# Patient Record
Sex: Male | Born: 1960 | Race: White | Hispanic: No | Marital: Married | Smoking: Former smoker
Health system: Southern US, Community
[De-identification: ages and names within clinical notes are randomized; demographics above are authoritative.]

## PROBLEM LIST (undated history)

## (undated) DIAGNOSIS — D696 Thrombocytopenia, unspecified: Secondary | ICD-10-CM

## (undated) DIAGNOSIS — Z86010 Personal history of colonic polyps: Secondary | ICD-10-CM

## (undated) DIAGNOSIS — E7800 Pure hypercholesterolemia, unspecified: Secondary | ICD-10-CM

## (undated) DIAGNOSIS — M792 Neuralgia and neuritis, unspecified: Secondary | ICD-10-CM

## (undated) DIAGNOSIS — N529 Male erectile dysfunction, unspecified: Secondary | ICD-10-CM

## (undated) DIAGNOSIS — E559 Vitamin D deficiency, unspecified: Secondary | ICD-10-CM

## (undated) DIAGNOSIS — E669 Obesity, unspecified: Secondary | ICD-10-CM

## (undated) DIAGNOSIS — J301 Allergic rhinitis due to pollen: Secondary | ICD-10-CM

## (undated) DIAGNOSIS — C61 Malignant neoplasm of prostate: Secondary | ICD-10-CM

## (undated) DIAGNOSIS — F1910 Other psychoactive substance abuse, uncomplicated: Secondary | ICD-10-CM

## (undated) DIAGNOSIS — K635 Polyp of colon: Secondary | ICD-10-CM

## (undated) HISTORY — DX: Pure hypercholesterolemia, unspecified: E78.00

## (undated) HISTORY — PX: COLONOSCOPY: SHX174

## (undated) HISTORY — DX: Other psychoactive substance abuse, uncomplicated: F19.10

## (undated) HISTORY — DX: Personal history of colonic polyps: Z86.010

## (undated) HISTORY — DX: Polyp of colon: K63.5

## (undated) HISTORY — DX: Obesity, unspecified: E66.9

## (undated) HISTORY — DX: Male erectile dysfunction, unspecified: N52.9

## (undated) HISTORY — DX: Allergic rhinitis due to pollen: J30.1

## (undated) HISTORY — PX: MOUTH SURGERY: SHX715

## (undated) HISTORY — DX: Neuralgia and neuritis, unspecified: M79.2

## (undated) HISTORY — DX: Malignant neoplasm of prostate: C61

## (undated) HISTORY — DX: Thrombocytopenia, unspecified: D69.6

## (undated) HISTORY — DX: Vitamin D deficiency, unspecified: E55.9

---

## 1979-08-10 DIAGNOSIS — F1910 Other psychoactive substance abuse, uncomplicated: Secondary | ICD-10-CM

## 1979-08-10 HISTORY — DX: Other psychoactive substance abuse, uncomplicated: F19.10

## 2006-12-09 HISTORY — PX: KNEE ARTHROSCOPY: SHX127

## 2007-04-02 ENCOUNTER — Ambulatory Visit (HOSPITAL_COMMUNITY): Admission: RE | Admit: 2007-04-02 | Discharge: 2007-04-02 | Payer: Self-pay | Admitting: Family Medicine

## 2009-12-09 DIAGNOSIS — C61 Malignant neoplasm of prostate: Secondary | ICD-10-CM

## 2009-12-09 HISTORY — DX: Malignant neoplasm of prostate: C61

## 2010-05-15 ENCOUNTER — Encounter (INDEPENDENT_AMBULATORY_CARE_PROVIDER_SITE_OTHER): Payer: Self-pay | Admitting: *Deleted

## 2010-05-25 ENCOUNTER — Encounter (INDEPENDENT_AMBULATORY_CARE_PROVIDER_SITE_OTHER): Payer: Self-pay | Admitting: *Deleted

## 2010-05-29 ENCOUNTER — Ambulatory Visit: Payer: Self-pay | Admitting: Internal Medicine

## 2010-06-08 ENCOUNTER — Ambulatory Visit: Payer: Self-pay | Admitting: Internal Medicine

## 2010-06-08 DIAGNOSIS — Z860101 Personal history of adenomatous and serrated colon polyps: Secondary | ICD-10-CM

## 2010-06-08 DIAGNOSIS — Z86010 Personal history of colonic polyps: Secondary | ICD-10-CM

## 2010-06-08 HISTORY — DX: Personal history of adenomatous and serrated colon polyps: Z86.0101

## 2010-06-08 HISTORY — DX: Personal history of colonic polyps: Z86.010

## 2010-06-08 HISTORY — PX: COLONOSCOPY W/ BIOPSIES: SHX1374

## 2010-06-14 ENCOUNTER — Encounter: Payer: Self-pay | Admitting: Internal Medicine

## 2010-08-10 ENCOUNTER — Ambulatory Visit (HOSPITAL_COMMUNITY): Admission: RE | Admit: 2010-08-10 | Discharge: 2010-08-10 | Payer: Self-pay | Admitting: Urology

## 2010-09-24 ENCOUNTER — Inpatient Hospital Stay (HOSPITAL_COMMUNITY): Admission: RE | Admit: 2010-09-24 | Discharge: 2010-09-25 | Payer: Self-pay | Admitting: Urology

## 2010-09-24 ENCOUNTER — Encounter (INDEPENDENT_AMBULATORY_CARE_PROVIDER_SITE_OTHER): Payer: Self-pay | Admitting: Urology

## 2010-09-24 HISTORY — PX: PROSTATECTOMY: SHX69

## 2011-01-08 NOTE — Letter (Signed)
Summary: [facility] Instructions  [facility]  [address] [place], [place] [address]   Phone: [phone]  Fax: [phone]       Omar [name]    08-15-1961    MRN: [id number]        Procedure Day Dorna Bloom:  Farrell Ours  06/08/10     Arrival Time:  8:00AM      Procedure Time:  9:00AM     Location of Procedure:                    Juliann Pares _  [facility] (4th Floor)                       PREPARATION FOR COLONOSCOPY WITH MOVIPREP   Starting 5 days prior to your procedure 06/03/10 do not eat nuts, seeds, popcorn, corn, beans, peas,  salads, or any raw vegetables.  Do not take any fiber supplements (e.g. Metamucil, Citrucel, and Benefiber).  THE DAY BEFORE YOUR PROCEDURE         DATE: 06/07/10  DAY: THURSDAY  1.  Drink clear liquids the entire day-NO SOLID FOOD  2.  Do not drink anything colored red or purple.  Avoid juices with pulp.  No orange juice.  3.  Drink at least 64 oz. (8 glasses) of fluid/clear liquids during the day to prevent dehydration and help the prep work efficiently.  CLEAR LIQUIDS INCLUDE: Water Jello Ice Popsicles Tea (sugar ok, no milk/cream) Powdered fruit flavored drinks Coffee (sugar ok, no milk/cream) Gatorade Juice: apple, white grape, white cranberry  Lemonade Clear bullion, consomm, broth Carbonated beverages (any kind) Strained chicken noodle soup Hard Candy                             4.  In the morning, mix first dose of MoviPrep solution:    Empty 1 Pouch A and 1 Pouch B into the disposable container    Add lukewarm drinking water to the top line of the container. Mix to dissolve    Refrigerate (mixed solution should be used within 24 hrs)  5.  Begin drinking the prep at 5:00 p.m. The MoviPrep container is divided by 4 marks.   Every 15 minutes drink the solution down to the next mark (approximately 8 oz) until the full liter is complete.   6.  Follow completed prep with 16 oz of clear liquid of your choice  (Nothing red or purple).  Continue to drink clear liquids until bedtime.  7.  Before going to bed, mix second dose of MoviPrep solution:    Empty 1 Pouch A and 1 Pouch B into the disposable container    Add lukewarm drinking water to the top line of the container. Mix to dissolve    Refrigerate  THE DAY OF YOUR PROCEDURE      DATE: 06/08/10  DAY: FRIDAY  Beginning at 4:00AM (5 hours before procedure):         1. Every 15 minutes, drink the solution down to the next mark (approx 8 oz) until the full liter is complete.  2. Follow completed prep with 16 oz. of clear liquid of your choice.    3. You may drink clear liquids until 7:00AM (2 HOURS BEFORE PROCEDURE).   MEDICATION INSTRUCTIONS  Unless otherwise instructed, you should take regular prescription medications with a small sip of water   as early as possible the morning  of your procedure.  Diabetic patients - see separate instructions.  Stop taking Plavix or Aggrenox on  _  _  (7 days before procedure).     Stop taking Coumadin on  _ _  (5 days before procedure).  Additional medication instructions: _         OTHER INSTRUCTIONS  You will need a responsible adult at least 51 years of age to accompany you and drive you home.   This person must remain in the waiting room during your procedure.  Wear loose fitting clothing that is easily removed.  Leave jewelry and other valuables at home.  However, you may wish to bring a book to read or  an iPod/MP3 player to listen to music as you wait for your procedure to start.  Remove all body piercing jewelry and leave at home.  Total time from sign-in until discharge is approximately 2-3 hours.  You should go home directly after your procedure and rest.  You can resume normal activities the  day after your procedure.  The day of your procedure you should not:   Drive   Make legal decisions   Operate machinery   Drink alcohol   Return to work  You will receive  specific instructions about eating, activities and medications before you leave.    The above instructions have been reviewed and explained to me by   _______________________    I fully understand and can verbalize these instructions _____________________________ Date _________

## 2011-01-08 NOTE — Procedures (Signed)
Summary: Colonoscopy  Patient: Omar [name] Note: All result statuses are Final unless otherwise noted.  Tests: (1) Colonoscopy (COL)   COL Colonoscopy           DONE     [facility]     [address]     [place], [place]  [address]           COLONOSCOPY PROCEDURE REPORT           PATIENT:  [name], [name]  MR#:  [id number]     BIRTHDATE:  Apr 24, 1961, 48 yrs. old  GENDER:  male     ENDOSCOPIST:  Iva Boop, MD, [facility]     REF. BY:  Charlesetta Shanks, M.D.     PROCEDURE DATE:  06/08/2010     PROCEDURE:  Colonoscopy with biopsy     ASA CLASS:  Class I     INDICATIONS:  Routine Risk Screening     MEDICATIONS:   Fentanyl 75 mcg IV, Versed 9 mg IV           DESCRIPTION OF PROCEDURE:   After the risks benefits and     alternatives of the procedure were thoroughly explained, informed     consent was obtained.  Digital rectal exam was performed and     revealed no abnormalities and normal prostate.   The LB [id number]     [id number] endoscope was introduced through the anus and advanced to     the cecum, which was identified by both the appendix and ileocecal     valve, without limitations.  The quality of the prep was     excellent, using MoviPrep.  The instrument was then slowly     withdrawn as the colon was fully examined.     Insertion: 2:01 minutes Withdrawal: 13:41 minutes     <<PROCEDUREIMAGES>>           FINDINGS:  Two polyps were found. They were diminutive. 1-2 mm     ascending colon polyp and a 2-3 mm sigmoid polyp. The polyps were     removed using cold biopsy forceps.  This was otherwise a normal     examination of the colon.   Retroflexed views in the rectum     revealed internal hemorrhoids.    The scope was then withdrawn     from the patient and the procedure completed.           COMPLICATIONS:  None     ENDOSCOPIC IMPRESSION:     1) Two diminutive polyps removed     2) Internal hemorrhoids     3) Otherwise normal examination, excellent prep        REPEAT EXAM:  In for Colonoscopy, pending biopsy results.           Iva Boop, MD, [facility]           CC:  Charlesetta Shanks, MD and The Patient           n.     eSIGNED:   Iva Boop at 06/08/2010 09:43 AM           Rosezena Sensor, [id number]  Note: An exclamation mark (!) indicates a result that was not dispersed into the flowsheet. Document Creation Date: 06/08/2010 9:43 AM _______________________________________________________________________  (1) Order result status: Final Collection or observation date-time: 06/08/2010 09:27 Requested date-time:  Receipt date-time:  Reported date-time:  Referring Physician:   Ordering Physician: Stan Head [phone]) Specimen Source:  Source: EndoProS  Filler Order Number: [id number] Lab site:   Appended Document: Colonoscopy     Procedures Next Due Date:    Colonoscopy: 06/2015

## 2011-01-08 NOTE — Letter (Signed)
Summary: Patient Notice- Polyp Results  [facility]  [address] [place], [place] [address]   Phone: [phone]  Fax: [phone]        June 14, 2010 MRN: [id number]    Omar [name] [address] [place], [place]  [address]    Dear [name],  One of the polyps removed from your colon was adenomatous. This means that it was pre-cancerous or that  it had the potential to change into cancer over time. The other polyp turned out to be normal colon tissue that looked like a polyp.  I recommend that you have a repeat colonoscopy in 5 years to determine if you have developed any new polyps over time. If you develop any new rectal bleeding, abdominal pain or significant bowel habit changes, please contact us before then.  Please call us if you are having persistent problems or have questions about your condition that have not been fully answered at this time.  Sincerely,  Iva Boop MD, [facility]  This letter has been electronically signed by your physician.  Appended Document: Patient Notice- Polyp Results letter mailed

## 2011-01-08 NOTE — Miscellaneous (Signed)
Summary: DIR COL SCR-AGE...EM  Clinical Lists Changes  Medications: Added new medication of MOVIPREP 100 GM SOLR (PEG-KCL-NACL-NASULF-NA ASC-C) as directed - Signed Rx of MOVIPREP 100 GM SOLR (PEG-KCL-NACL-NASULF-NA ASC-C) as directed;  #1 x 0;  Signed;  Entered by: Darcey Nora RN, CGRN;  Authorized by: Iva Boop MD, FACG;  Method used: Electronically to [facility] [address] [id number]*, [address], [place], [place], [place]  [address], Ph: [phone], Fax: [phone]    Prescriptions: MOVIPREP 100 GM SOLR (PEG-KCL-NACL-NASULF-NA ASC-C) as directed  #1 x 0   Entered by:   Darcey Nora RN, CGRN   Authorized by:   Iva Boop MD, [facility]   Signed by:   Darcey Nora RN, CGRN on 05/29/2010   Method used:   Electronically to        [facility] [id number]* (retail)       [address]       [place], [place]  [address]       Ph: [phone]       Fax: [phone]   RxID:   [id number]

## 2011-01-08 NOTE — Letter (Signed)
Summary: Previsit letter  [facility]  [address] [place], [place] [address]   Phone: [phone]  Fax: [phone]       05/15/2010 MRN: [id number]  Omar [name] [address] [place], [place]  [address]  Dear Mr. Nale,  Welcome to the Gastroenterology Division at [facility].    You are scheduled to see a nurse for your pre-procedure visit on 05-29-10 at 10:30am on the 3rd floor at [facility], [address]  We ask that you try to arrive at our office 15 minutes prior to your appointment time to allow for check-in.  Your nurse visit will consist of discussing your medical and surgical history, your immediate family medical history, and your medications.    Please bring a complete list of all your medications or, if you prefer, bring the medication bottles and we will list them.  We will need to be aware of both prescribed and over the counter drugs.  We will need to know exact dosage information as well.  If you are on blood thinners (Coumadin, Plavix, Aggrenox, Ticlid, etc.) please call our office today/prior to your appointment, as we need to consult with your physician about holding your medication.   Please be prepared to read and sign documents such as consent forms, a financial agreement, and acknowledgement forms.  If necessary, and with your consent, a friend or relative is welcome to sit-in on the nurse visit with you.  Please bring your insurance card so that we may make a copy of it.  If your insurance requires a referral to see a specialist, please bring your referral form from your primary care physician.  No co-pay is required for this nurse visit.     If you cannot keep your appointment, please call [phone] to cancel or reschedule prior to your appointment date.  This allows [place] the opportunity to schedule an appointment for another patient in need of care.    Thank you for choosing [facility] for your  medical needs.  We appreciate the opportunity to care for you.  Please visit [place] at our website  to learn more about our practice.                     Sincerely.                                                                                                                   The Gastroenterology Division

## 2011-02-20 LAB — TYPE AND SCREEN
ABO/RH(D): B POS
Antibody Screen: NEGATIVE

## 2011-02-20 LAB — HEMOGLOBIN AND HEMATOCRIT, BLOOD
HCT: 39.9 % (ref 39.0–52.0)
HCT: 40.3 % (ref 39.0–52.0)
Hemoglobin: 13.9 g/dL (ref 13.0–17.0)
Hemoglobin: 14.2 g/dL (ref 13.0–17.0)

## 2011-02-20 LAB — ABO/RH: ABO/RH(D): B POS

## 2011-02-21 LAB — CBC
HCT: 42.4 % (ref 39.0–52.0)
Hemoglobin: 15 g/dL (ref 13.0–17.0)
MCH: 32.3 pg (ref 26.0–34.0)
MCHC: 35.5 g/dL (ref 30.0–36.0)
MCV: 91.1 fL (ref 78.0–100.0)
Platelets: 134 10*3/uL — ABNORMAL LOW (ref 150–400)
RBC: 4.65 MIL/uL (ref 4.22–5.81)
RDW: 13.1 % (ref 11.5–15.5)
WBC: 5.7 10*3/uL (ref 4.0–10.5)

## 2011-02-21 LAB — BASIC METABOLIC PANEL
BUN: 12 mg/dL (ref 6–23)
CO2: 27 mEq/L (ref 19–32)
Calcium: 9.1 mg/dL (ref 8.4–10.5)
Chloride: 106 mEq/L (ref 96–112)
Creatinine, Ser: 0.9 mg/dL (ref 0.4–1.5)
GFR calc Af Amer: >60 mL/min (ref >60)
GFR calc non Af Amer: >60 mL/min (ref >60)
Glucose, Bld: 96 mg/dL (ref 70–99)
Potassium: 4.1 mEq/L (ref 3.5–5.1)
Sodium: 139 mEq/L (ref 135–145)

## 2011-02-21 LAB — SURGICAL PCR SCREEN
MRSA, PCR: NEGATIVE
Staphylococcus aureus: NEGATIVE

## 2013-01-01 ENCOUNTER — Encounter: Payer: Self-pay | Admitting: Internal Medicine

## 2013-01-07 ENCOUNTER — Encounter: Payer: Self-pay | Admitting: Internal Medicine

## 2013-01-20 ENCOUNTER — Encounter: Payer: Self-pay | Admitting: *Deleted

## 2013-01-26 ENCOUNTER — Ambulatory Visit: Payer: Self-pay | Admitting: Internal Medicine

## 2013-02-11 ENCOUNTER — Telehealth: Payer: Self-pay | Admitting: *Deleted

## 2013-02-11 NOTE — Telephone Encounter (Signed)
Tried to reach patient about weather information.  Home phone is disconnected and at work number could not find him.

## 2013-02-12 ENCOUNTER — Ambulatory Visit: Payer: Self-pay | Admitting: Internal Medicine

## 2013-02-26 ENCOUNTER — Encounter: Payer: Self-pay | Admitting: Internal Medicine

## 2013-03-23 ENCOUNTER — Ambulatory Visit: Payer: Self-pay | Admitting: Internal Medicine

## 2013-04-15 ENCOUNTER — Ambulatory Visit: Payer: Self-pay | Admitting: Internal Medicine

## 2015-05-26 ENCOUNTER — Encounter: Payer: Self-pay | Admitting: Internal Medicine

## 2015-05-29 ENCOUNTER — Ambulatory Visit (AMBULATORY_SURGERY_CENTER): Payer: Self-pay | Admitting: *Deleted

## 2015-05-29 VITALS — Ht 72.0 in | Wt 221.0 lb

## 2015-05-29 DIAGNOSIS — Z86010 Personal history of colonic polyps: Secondary | ICD-10-CM

## 2015-05-29 NOTE — Progress Notes (Signed)
No post op anesthesia complications Not on diet drugs Not on home 02 No egg or soy allergies emmi video emailed to home email address

## 2015-06-19 ENCOUNTER — Encounter: Payer: Self-pay | Admitting: Internal Medicine

## 2015-06-30 ENCOUNTER — Ambulatory Visit (AMBULATORY_SURGERY_CENTER): Payer: BC Managed Care – PPO | Admitting: Internal Medicine

## 2015-06-30 ENCOUNTER — Encounter: Payer: Self-pay | Admitting: Internal Medicine

## 2015-06-30 VITALS — BP 113/72 | HR 57 | Temp 97.9°F | Resp 32 | Ht 72.0 in | Wt 221.0 lb

## 2015-06-30 DIAGNOSIS — D125 Benign neoplasm of sigmoid colon: Secondary | ICD-10-CM

## 2015-06-30 DIAGNOSIS — Z86010 Personal history of colonic polyps: Secondary | ICD-10-CM

## 2015-06-30 DIAGNOSIS — D123 Benign neoplasm of transverse colon: Secondary | ICD-10-CM | POA: Diagnosis not present

## 2015-06-30 DIAGNOSIS — K635 Polyp of colon: Secondary | ICD-10-CM

## 2015-06-30 DIAGNOSIS — D124 Benign neoplasm of descending colon: Secondary | ICD-10-CM

## 2015-06-30 MED ORDER — SODIUM CHLORIDE 0.9 % IV SOLN: 500.0000 mL | INTRAVENOUS | Status: DC

## 2015-06-30 NOTE — Progress Notes (Signed)
Called to room to assist during endoscopic procedure.  Patient ID and intended procedure confirmed with present staff. Received instructions for my participation in the procedure from the performing physician.  

## 2015-06-30 NOTE — Op Note (Signed)
[facility]  [address] [place], [address]   COLONOSCOPY PROCEDURE REPORT  PATIENT: [name], Omar  MR#: [id number] BIRTHDATE: October 20, 1961 , 79  yrs. old GENDER: male ENDOSCOPIST: Gatha Mayer, MD, [facility] PROCEDURE DATE:  06/30/2015 PROCEDURE:   Colonoscopy, surveillance , Colonoscopy with biopsy, and Colonoscopy with snare polypectomy First Screening Colonoscopy - Avg.  risk and is 50 yrs.  old or older - No.  Prior Negative Screening - Now for repeat screening. N/A  History of Adenoma - Now for follow-up colonoscopy & has been > or = to 3 yrs.  Yes hx of adenoma.  Has been 3 or more years since last colonoscopy.  Polyps removed today? Yes ASA CLASS:   Class II INDICATIONS:Surveillance due to prior colonic neoplasia and PH Colon Adenoma. MEDICATIONS: Propofol 330 mg IV and Monitored anesthesia care  DESCRIPTION OF PROCEDURE:   After the risks benefits and alternatives of the procedure were thoroughly explained, informed consent was obtained.  The digital rectal exam revealed no rectal mass and revealed a surgically absent prostate.   The LB [id number]  endoscope was introduced through the anus and advanced to the cecum, which was identified by both the appendix and ileocecal valve. No adverse events experienced.   The quality of the prep was excellent.  (MiraLax was used)  The instrument was then slowly withdrawn as the colon was fully examined. Estimated blood loss is zero unless otherwise noted in this procedure report.      COLON FINDINGS: Four sessile polyps ranging from 2 to 39mm in size were found in the transverse colon, descending colon, and sigmoid colon.  Polypectomies were performed with cold forceps and with a cold snare.  The resection was complete, the polyp tissue was completely retrieved and sent to histology.   The examination was otherwise normal.  Retroflexed views revealed no abnormalities. The time to cecum = 2.9 Withdrawal  time = 10.1   The scope was withdrawn and the procedure completed. COMPLICATIONS: There were no immediate complications.  ENDOSCOPIC IMPRESSION: 1.   Four sessile polyps ranging from 2 to 51mm in size were found in the transverse colon, descending colon, and sigmoid colon; polypectomies were performed with cold forceps and with a cold snare 2.   The examination was otherwise normal - hx adenoma 2011 + FHx CRCA elderly mother  RECOMMENDATIONS: Timing of repeat colonoscopy will be determined by pathology findings.  eSigned:  Gatha Mayer, MD, [facility] 06/30/2015 3:57 PM   cc: The Patient and Velna Hatchet, MD

## 2015-06-30 NOTE — Patient Instructions (Addendum)
I found and removed 4 tiny polyps. I will let you know pathology results and when to have another routine colonoscopy by mail.  I appreciate the opportunity to care for you. Gatha Mayer, MD, FACG  YOU HAD AN ENDOSCOPIC PROCEDURE TODAY AT [facility]:   Refer to the procedure report that was given to you for any specific questions about what was found during the examination.  If the procedure report does not answer your questions, please call your gastroenterologist to clarify.  If you requested that your care partner not be given the details of your procedure findings, then the procedure report has been included in a sealed envelope for you to review at your convenience later.  YOU SHOULD EXPECT: Some feelings of bloating in the abdomen. Passage of more gas than usual.  Walking can help get rid of the air that was put into your GI tract during the procedure and reduce the bloating. If you had a lower endoscopy (such as a colonoscopy or flexible sigmoidoscopy) you may notice spotting of blood in your stool or on the toilet paper. If you underwent a bowel prep for your procedure, you may not have a normal bowel movement for a few days.  Please Note:  You might notice some irritation and congestion in your nose or some drainage.  This is from the oxygen used during your procedure.  There is no need for concern and it should clear up in a day or so.  SYMPTOMS TO REPORT IMMEDIATELY:   Following lower endoscopy (colonoscopy or flexible sigmoidoscopy):  Excessive amounts of blood in the stool  Significant tenderness or worsening of abdominal pains  Swelling of the abdomen that is new, acute  Fever of 100F or higher  For urgent or emergent issues, a gastroenterologist can be reached at any hour by calling [phone].   DIET: Your first meal following the procedure should be a small meal and then it is ok to progress to your normal diet. Heavy or fried foods are  harder to digest and may make you feel nauseous or bloated.  Likewise, meals heavy in dairy and vegetables can increase bloating.  Drink plenty of fluids but you should avoid alcoholic beverages for 24 hours.  ACTIVITY:  You should plan to take it easy for the rest of today and you should NOT DRIVE or use heavy machinery until tomorrow (because of the sedation medicines used during the test).    FOLLOW UP: Our staff will call the number listed on your records the next business day following your procedure to check on you and address any questions or concerns that you may have regarding the information given to you following your procedure. If we do not reach you, we will leave a message.  However, if you are feeling well and you are not experiencing any problems, there is no need to return our call.  We will assume that you have returned to your regular daily activities without incident.  If any biopsies were taken you will be contacted by phone or by letter within the next 1-3 weeks.  Please call us at [phone] if you have not heard about the biopsies in 3 weeks.    SIGNATURES/CONFIDENTIALITY: You and/or your care partner have signed paperwork which will be entered into your electronic medical record.  These signatures attest to the fact that that the information above on your After Visit Summary has been reviewed and is understood.  Full responsibility  of the confidentiality of this discharge information lies with you and/or your care-partner.  Polyp information given.

## 2015-06-30 NOTE — Progress Notes (Signed)
To recovery, report to Scott, RN, VSS 

## 2015-07-03 ENCOUNTER — Telehealth: Payer: Self-pay | Admitting: *Deleted

## 2015-07-03 NOTE — Telephone Encounter (Signed)
  Follow up Call-  Call back number 06/30/2015  Post procedure Call Back phone  # [phone]  Permission to leave phone message Yes     Patient questions:  Do you have a fever, pain , or abdominal swelling? No. Pain Score  0 *  Have you tolerated food without any problems? Yes.    Have you been able to return to your normal activities? Yes.    Do you have any questions about your discharge instructions: Diet   No. Medications  No. Follow up visit  No.  Do you have questions or concerns about your Care? No.  Actions: * If pain score is 4 or above: No action needed, pain <4.

## 2015-07-04 ENCOUNTER — Encounter: Payer: Self-pay | Admitting: Internal Medicine

## 2015-07-04 DIAGNOSIS — Z86010 Personal history of colonic polyps: Secondary | ICD-10-CM

## 2015-07-04 NOTE — Progress Notes (Signed)
Quick Note:  3 diminutive adenomas - repeat colonoscopy 3 years 2019 ______

## 2015-11-08 ENCOUNTER — Encounter: Payer: Self-pay | Admitting: Internal Medicine

## 2016-09-04 ENCOUNTER — Other Ambulatory Visit: Payer: Self-pay | Admitting: Internal Medicine

## 2016-09-04 DIAGNOSIS — R740 Nonspecific elevation of levels of transaminase and lactic acid dehydrogenase [LDH]: Principal | ICD-10-CM

## 2016-09-04 DIAGNOSIS — R7401 Elevation of levels of liver transaminase levels: Secondary | ICD-10-CM

## 2016-09-12 ENCOUNTER — Ambulatory Visit
Admission: RE | Admit: 2016-09-12 | Discharge: 2016-09-12 | Disposition: A | Discharge status: Home / Self Care | Payer: BC Managed Care – PPO | Source: Ambulatory Visit | Attending: Internal Medicine | Admitting: Internal Medicine

## 2016-09-12 DIAGNOSIS — R740 Nonspecific elevation of levels of transaminase and lactic acid dehydrogenase [LDH]: Principal | ICD-10-CM

## 2016-09-12 DIAGNOSIS — R7401 Elevation of levels of liver transaminase levels: Secondary | ICD-10-CM

## 2016-10-01 ENCOUNTER — Institutional Professional Consult (permissible substitution): Payer: BC Managed Care – PPO | Admitting: Neurology

## 2018-10-05 ENCOUNTER — Encounter: Payer: Self-pay | Admitting: Internal Medicine

## 2018-10-21 ENCOUNTER — Encounter: Payer: Self-pay | Admitting: Internal Medicine

## 2018-11-04 ENCOUNTER — Ambulatory Visit (AMBULATORY_SURGERY_CENTER): Payer: Self-pay | Admitting: *Deleted

## 2018-11-04 VITALS — Ht 71.0 in | Wt 213.0 lb

## 2018-11-04 DIAGNOSIS — Z86010 Personal history of colonic polyps: Secondary | ICD-10-CM

## 2018-11-04 NOTE — Progress Notes (Signed)
Patient denies any allergies to eggs or soy. Patient denies any problems with anesthesia/sedation. Patient denies any oxygen use at home. Patient denies taking any diet/weight loss medications or blood thinners. EMMI education offered, pt declined.  

## 2018-11-11 ENCOUNTER — Encounter: Payer: Self-pay | Admitting: Internal Medicine

## 2018-11-25 ENCOUNTER — Encounter: Payer: Self-pay | Admitting: Internal Medicine

## 2018-11-25 ENCOUNTER — Ambulatory Visit (AMBULATORY_SURGERY_CENTER): Payer: BC Managed Care – PPO | Admitting: Internal Medicine

## 2018-11-25 VITALS — BP 110/69 | HR 52 | Temp 98.0°F | Resp 14 | Ht 71.0 in | Wt 213.0 lb

## 2018-11-25 DIAGNOSIS — Z86010 Personal history of colonic polyps: Secondary | ICD-10-CM | POA: Diagnosis not present

## 2018-11-25 DIAGNOSIS — D125 Benign neoplasm of sigmoid colon: Secondary | ICD-10-CM

## 2018-11-25 DIAGNOSIS — D120 Benign neoplasm of cecum: Secondary | ICD-10-CM

## 2018-11-25 MED ORDER — SODIUM CHLORIDE 0.9 % IV SOLN: 500.0000 mL | Freq: Once | INTRAVENOUS | Status: DC

## 2018-11-25 NOTE — Progress Notes (Signed)
Called to room to assist during endoscopic procedure.  Patient ID and intended procedure confirmed with present staff. Received instructions for my participation in the procedure from the performing physician.  

## 2018-11-25 NOTE — Progress Notes (Signed)
PT taken to PACU. Monitors in place. VSS. Report given to RN. 

## 2018-11-25 NOTE — Patient Instructions (Addendum)
   I found and removed 3 tiny polyps. I will let you know pathology results and when to have another routine colonoscopy by mail and/or My Chart.  I appreciate the opportunity to care for you. Gatha Mayer, MD, [facility]   Handout given on polyps.   YOU HAD AN ENDOSCOPIC PROCEDURE TODAY AT [facility]:   Refer to the procedure report that was given to you for any specific questions about what was found during the examination.  If the procedure report does not answer your questions, please call your gastroenterologist to clarify.  If you requested that your care partner not be given the details of your procedure findings, then the procedure report has been included in a sealed envelope for you to review at your convenience later.  YOU SHOULD EXPECT: Some feelings of bloating in the abdomen. Passage of more gas than usual.  Walking can help get rid of the air that was put into your GI tract during the procedure and reduce the bloating. If you had a lower endoscopy (such as a colonoscopy or flexible sigmoidoscopy) you may notice spotting of blood in your stool or on the toilet paper. If you underwent a bowel prep for your procedure, you may not have a normal bowel movement for a few days.  Please Note:  You might notice some irritation and congestion in your nose or some drainage.  This is from the oxygen used during your procedure.  There is no need for concern and it should clear up in a day or so.  SYMPTOMS TO REPORT IMMEDIATELY:   Following lower endoscopy (colonoscopy or flexible sigmoidoscopy):  Excessive amounts of blood in the stool  Significant tenderness or worsening of abdominal pains  Swelling of the abdomen that is new, acute  Fever of 100F or higher   For urgent or emergent issues, a gastroenterologist can be reached at any hour by calling [phone].   DIET:  We do recommend a small meal at first, but then you may proceed to your regular diet.  Drink  plenty of fluids but you should avoid alcoholic beverages for 24 hours.  ACTIVITY:  You should plan to take it easy for the rest of today and you should NOT DRIVE or use heavy machinery until tomorrow (because of the sedation medicines used during the test).    FOLLOW UP: Our staff will call the number listed on your records the next business day following your procedure to check on you and address any questions or concerns that you may have regarding the information given to you following your procedure. If we do not reach you, we will leave a message.  However, if you are feeling well and you are not experiencing any problems, there is no need to return our call.  We will assume that you have returned to your regular daily activities without incident.  If any biopsies were taken you will be contacted by phone or by letter within the next 1-3 weeks.  Please call us at [phone] if you have not heard about the biopsies in 3 weeks.    SIGNATURES/CONFIDENTIALITY: You and/or your care partner have signed paperwork which will be entered into your electronic medical record.  These signatures attest to the fact that that the information above on your After Visit Summary has been reviewed and is understood.  Full responsibility of the confidentiality of this discharge information lies with you and/or your care-partner.

## 2018-11-25 NOTE — Op Note (Signed)
[place] [facility] Patient Name: Omar [name] Procedure Date: 11/25/2018 3:12 PM MRN: [id number] Endoscopist: Gatha Mayer , MD Age: 57 Referring MD:  Date of Birth: [birth year] Gender: Male Account #: [id number] Procedure:                Colonoscopy Indications:              Surveillance: Personal history of adenomatous                            polyps on last colonoscopy 3 years ago Medicines:                Propofol per Anesthesia, Monitored Anesthesia Care Procedure:                Pre-Anesthesia Assessment:                           - Prior to the procedure, a History and Physical                            was performed, and patient medications and                            allergies were reviewed. The patient's tolerance of                            previous anesthesia was also reviewed. The risks                            and benefits of the procedure and the sedation                            options and risks were discussed with the patient.                            All questions were answered, and informed consent                            was obtained. Prior Anticoagulants: The patient has                            taken no previous anticoagulant or antiplatelet                            agents. ASA Grade Assessment: II - A patient with                            mild systemic disease. After reviewing the risks                            and benefits, the patient was deemed in                            satisfactory condition to undergo the procedure.  After obtaining informed consent, the colonoscope                            was passed under direct vision. Throughout the                            procedure, the patient's blood pressure, pulse, and                            oxygen saturations were monitored continuously. The                            Colonoscope was introduced through the anus and   advanced to the the cecum, identified by                            appendiceal orifice and ileocecal valve. The                            colonoscopy was performed without difficulty. The                            patient tolerated the procedure well. The quality                            of the bowel preparation was good. The bowel                            preparation used was Miralax. The ileocecal valve,                            appendiceal orifice, and rectum were photographed. Scope In: 3:24:30 PM Scope Out: 3:43:46 PM Scope Withdrawal Time: 0 hours 16 minutes 59 seconds  Total Procedure Duration: 0 hours 19 minutes 16 seconds  Findings:                 The perianal examination was normal.                           The digital rectal exam findings include surgically                            absent prostate.                           Two sessile polyps were found in the sigmoid colon.                            The polyps were 3 to 4 mm in size. These polyps                            were removed with a cold snare. Resection and                            retrieval  were complete. Verification of patient                            identification for the specimen was done. Estimated                            blood loss was minimal. Estimated blood loss was                            minimal.                           A 1 mm polyp was found in the cecum. The polyp was                            sessile. The polyp was removed with a cold snare.                            Resection and retrieval were complete. Verification                            of patient identification for the specimen was                            done. Estimated blood loss was minimal.                           The exam was otherwise without abnormality on                            direct and retroflexion views. Complications:            No immediate complications. Estimated Blood Loss:     Estimated  blood loss was minimal. 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                           - Two 3 to 4 mm polyps in the sigmoid colon,                            removed with a cold snare. Resected and retrieved.                           - One 1 mm polyp in the cecum, removed with a cold                            snare. Resected and retrieved.                           - The examination was otherwise normal on direct  and retroflexion views.                           - Personal history of colonic polyps adenomas 2011                            and 2016. Recommendation:           - Patient has a contact number available for                            emergencies. The signs and symptoms of potential                            delayed complications were discussed with the                            patient. Return to normal activities tomorrow.                            Written discharge instructions were provided to the                            patient.                           - Resume previous diet.                           - Continue present medications.                           - Repeat colonoscopy is recommended for                            surveillance. The colonoscopy date will be                            determined after pathology results from today's                            exam become available for review. Gatha Mayer, MD 11/25/2018 3:49:52 PM This report has been signed electronically.

## 2018-11-26 ENCOUNTER — Telehealth: Payer: Self-pay

## 2018-11-26 NOTE — Telephone Encounter (Signed)
  Follow up Call-  Call back number 11/25/2018  Post procedure Call Back phone  # [phone]  Permission to leave phone message Yes  Some recent data might be hidden     Patient questions:  Do you have a fever, pain , or abdominal swelling? No. Pain Score  0 *  Have you tolerated food without any problems? Yes.    Have you been able to return to your normal activities? Yes.    Do you have any questions about your discharge instructions: Diet   No. Medications  No. Follow up visit  No.  Do you have questions or concerns about your Care? No.  Actions: * If pain score is 4 or above: No action needed, pain <4.

## 2018-12-07 ENCOUNTER — Encounter: Payer: Self-pay | Admitting: Internal Medicine

## 2018-12-07 DIAGNOSIS — Z86010 Personal history of colonic polyps: Secondary | ICD-10-CM

## 2018-12-07 NOTE — Progress Notes (Signed)
2 diminutive adenomas Recall 2024-5 (5 yrs)

## 2020-02-05 ENCOUNTER — Ambulatory Visit: Payer: BC Managed Care – PPO | Attending: Internal Medicine

## 2020-02-05 DIAGNOSIS — Z23 Encounter for immunization: Secondary | ICD-10-CM | POA: Insufficient documentation

## 2020-02-05 NOTE — Progress Notes (Signed)
   Covid-19 Vaccination Clinic  Name:  Omar [name]    MRN: [id number] DOB: [birth year]  02/05/2020  Mr. [name] was observed post Covid-19 immunization for 15 minutes without incidence. He was provided with Vaccine Information Sheet and instruction to access the V-Safe system.   Mr. [name] was instructed to call 911 with any severe reactions post vaccine: Marland Kitchen Difficulty breathing  . Swelling of your face and throat  . A fast heartbeat  . A bad rash all over your body  . Dizziness and weakness    Immunizations Administered    Name Date Dose VIS Date Route   Pfizer COVID-19 Vaccine 02/05/2020  6:57 PM 0.3 mL 11/19/2019 Intramuscular   Manufacturer: [place]   Lot: [id number]   [place]: [id number]

## 2020-02-26 ENCOUNTER — Ambulatory Visit: Payer: BC Managed Care – PPO | Attending: Internal Medicine

## 2020-02-26 DIAGNOSIS — Z23 Encounter for immunization: Secondary | ICD-10-CM

## 2020-02-26 NOTE — Progress Notes (Signed)
   Covid-19 Vaccination Clinic  Name:  Omar [name]    MRN: [id number] DOB: 1961-09-01  02/26/2020  Mr. Omar [name] was observed post Covid-19 immunization for 15 minutes without incident. He was provided with Vaccine Information Sheet and instruction to access the V-Safe system.   Mr. Omar [name] was instructed to call 911 with any severe reactions post vaccine: Marland Kitchen Difficulty breathing  . Swelling of face and throat  . A fast heartbeat  . A bad rash all over body  . Dizziness and weakness   Immunizations Administered    Name Date Dose VIS Date Route   Pfizer COVID-19 Vaccine 02/26/2020 10:09 AM 0.3 mL 11/19/2019 Intramuscular   Manufacturer: [place]   Lot: [id number]   [place]: [id number]

## 2021-10-03 ENCOUNTER — Ambulatory Visit
Admission: RE | Admit: 2021-10-03 | Discharge: 2021-10-03 | Disposition: A | Discharge status: Home / Self Care | Payer: Self-pay | Source: Ambulatory Visit | Attending: Physical Medicine and Rehabilitation | Admitting: Physical Medicine and Rehabilitation

## 2021-10-03 ENCOUNTER — Other Ambulatory Visit: Payer: Self-pay

## 2021-10-03 ENCOUNTER — Ambulatory Visit: Payer: Self-pay

## 2021-10-03 ENCOUNTER — Other Ambulatory Visit: Payer: Self-pay | Admitting: Physical Medicine and Rehabilitation

## 2021-10-03 DIAGNOSIS — R52 Pain, unspecified: Secondary | ICD-10-CM

## 2023-07-04 IMAGING — CR DG KNEE AP/LAT W/ SUNRISE*R*
3 series · 3 of 3 positions shown · non-contrast
Comparison: None.

CLINICAL DATA: Chronic bilateral increasing knee pain, left greater
than right

EXAM:
LEFT KNEE 3 VIEWS; RIGHT KNEE 3 VIEWS

[w knee ap right]
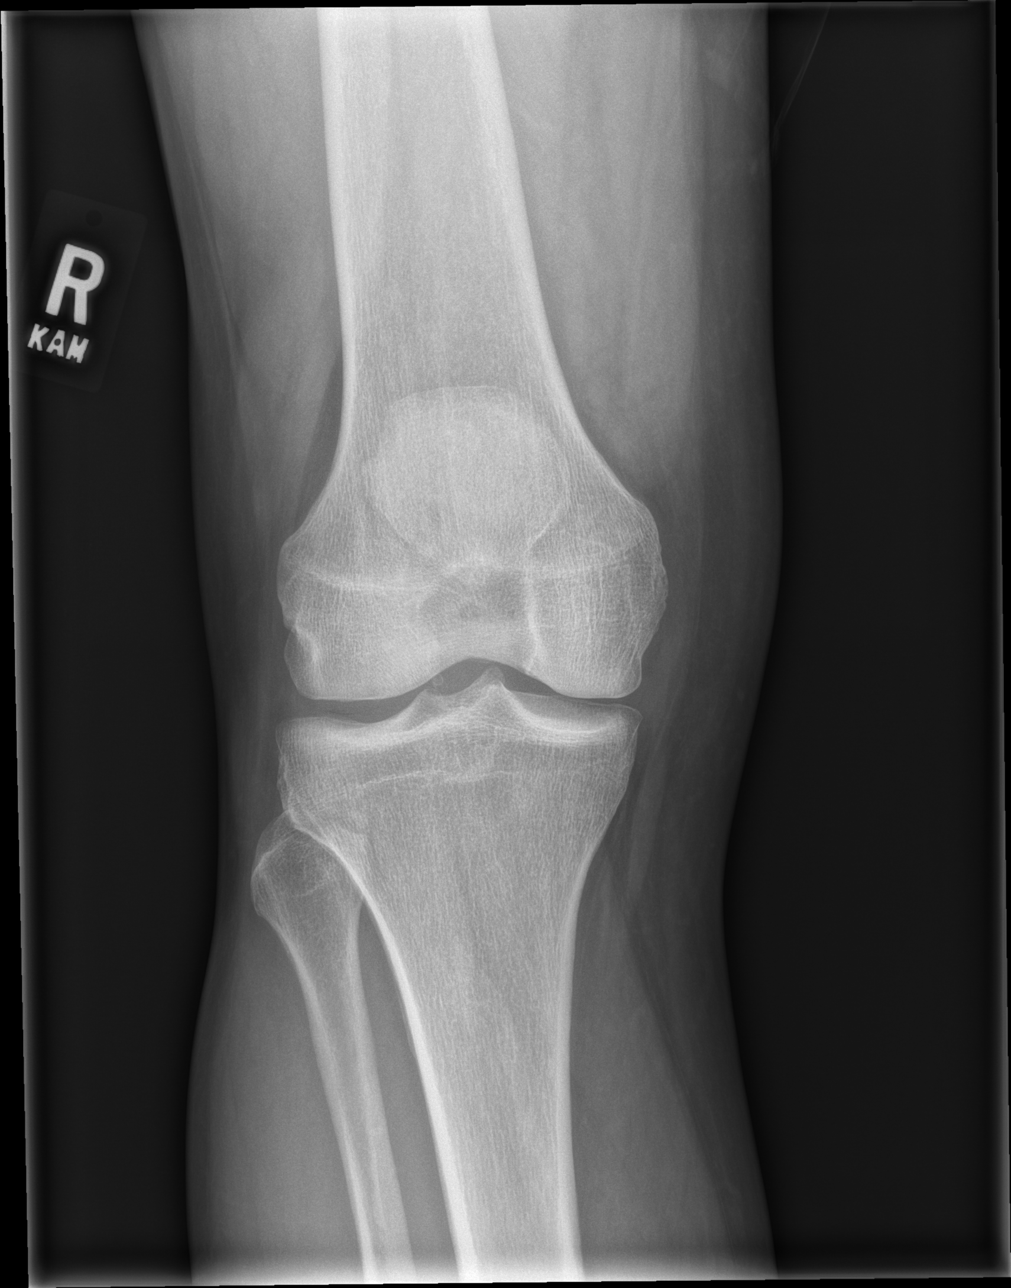

[w knee lat right]
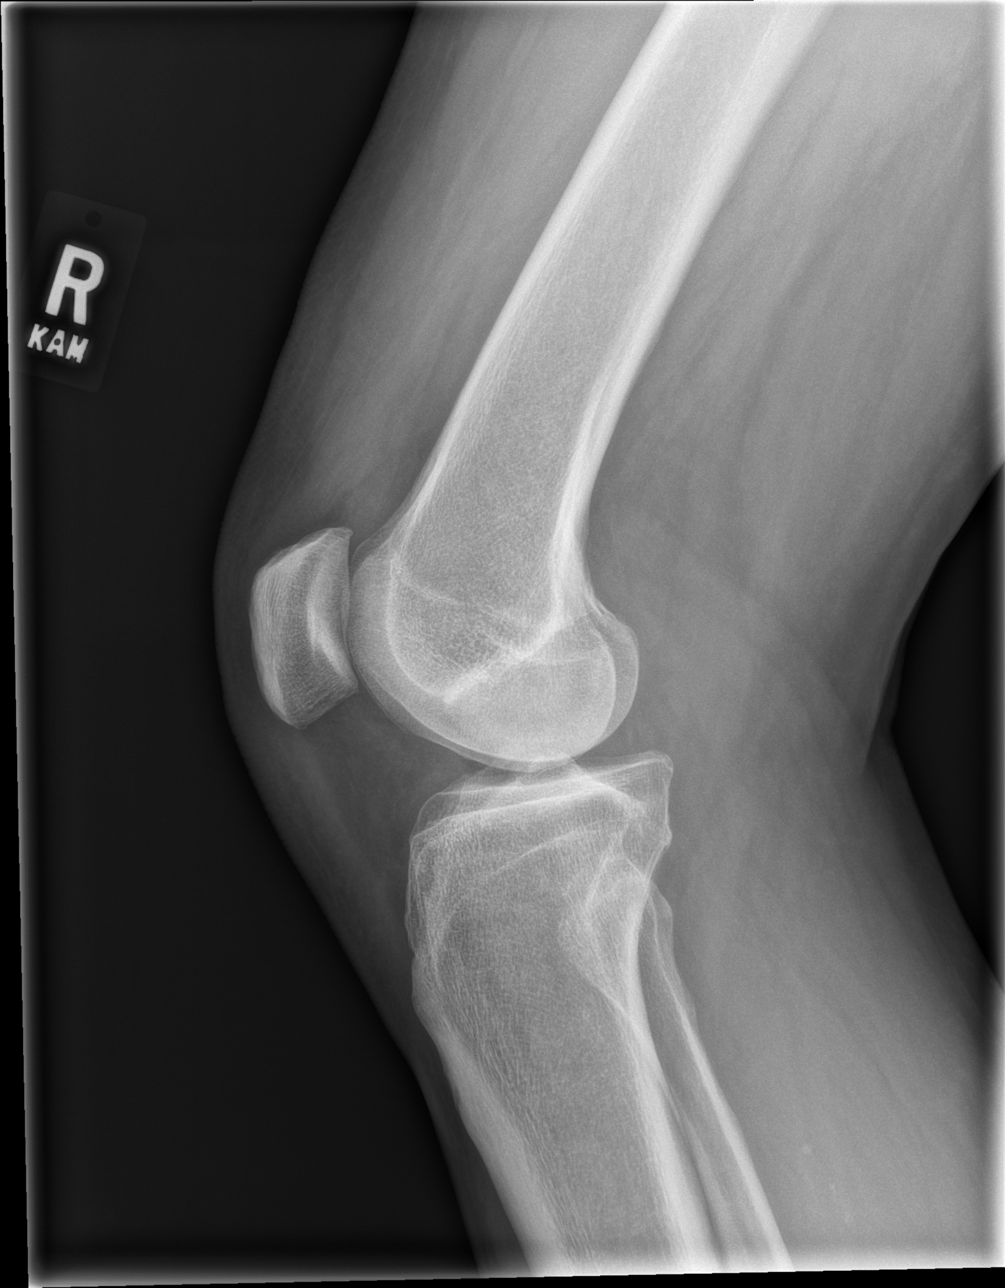

[x knee sunrise right]
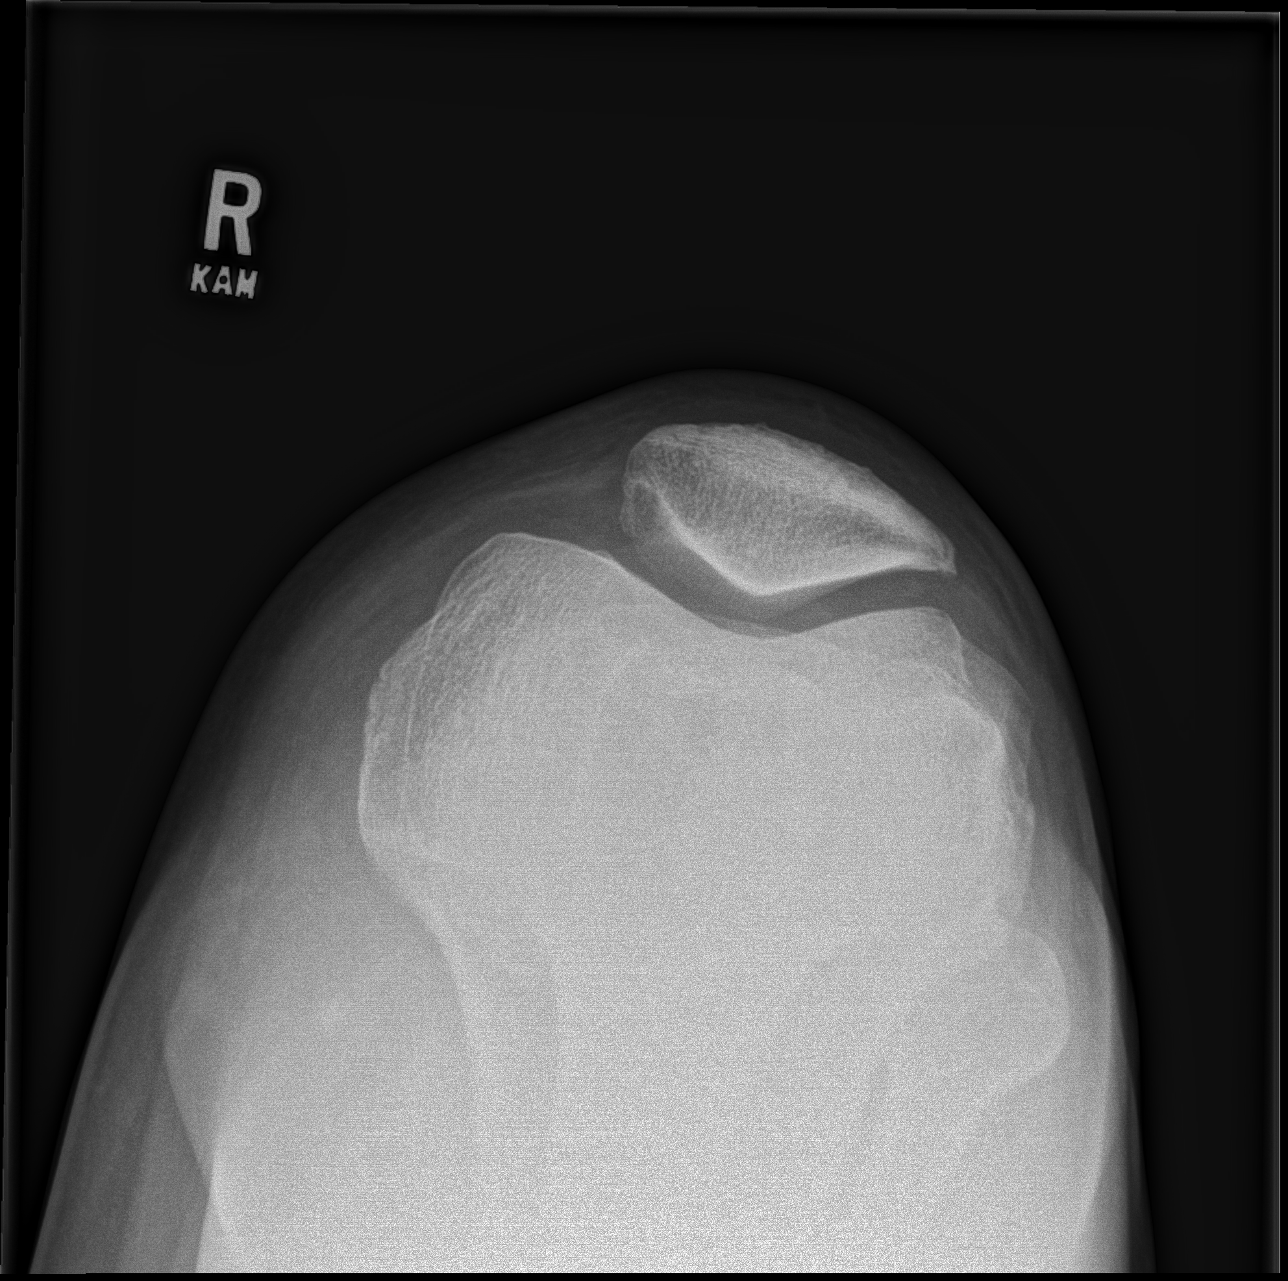

[3 of 3 positions shown; findings below may reference images not displayed]

FINDINGS: Right knee: Frontal, lateral, and sunrise views of the right knee
are obtained. No fracture, subluxation, or dislocation. Mild
patellofemoral compartmental osteoarthritis. No joint effusion. Soft
tissues are unremarkable.

Left knee: Frontal, lateral, and sunrise views of the left knee are
obtained. There is mild 3 compartmental osteoarthritis greatest in
the medial compartment. No fracture, subluxation, or dislocation. No
joint effusion. Soft tissues are unremarkable.
IMPRESSION: 1. Bilateral knee osteoarthritis, left greater than right.
2. No acute bony abnormality.

## 2023-10-29 ENCOUNTER — Encounter: Payer: Self-pay | Admitting: Internal Medicine

## 2023-11-04 ENCOUNTER — Encounter: Payer: Self-pay | Admitting: Internal Medicine

## 2023-11-17 ENCOUNTER — Telehealth: Payer: Self-pay | Admitting: *Deleted

## 2023-11-17 ENCOUNTER — Ambulatory Visit (AMBULATORY_SURGERY_CENTER): Payer: BC Managed Care – PPO | Admitting: *Deleted

## 2023-11-17 VITALS — Ht 72.0 in | Wt 208.0 lb

## 2023-11-17 DIAGNOSIS — Z860100 Personal history of colon polyps, unspecified: Secondary | ICD-10-CM

## 2023-11-17 NOTE — Telephone Encounter (Signed)
Unable to reach patient. Left VM requesting return call to reschedule PV to avoid cancellation of colonoscopy.

## 2023-11-17 NOTE — Telephone Encounter (Signed)
PT returned call 247pm please call [phone]

## 2023-11-17 NOTE — Progress Notes (Signed)
Previsit completed over telephone. Instructions mailed and sent through MyChart    No egg or soy allergy known to patient  No issues known to pt with past sedation with any surgeries or procedures Patient denies ever being told they had issues or difficulty with intubation  No FH of Malignant Hyperthermia Pt is not on diet pills Pt is not on  home 02  Pt is not on blood thinners  Pt denies issues with constipation  No A fib or A flutter Have any cardiac testing pending--NO Pt instructed to use [email or web address] or GoodRx for a price reduction on prep

## 2023-11-18 ENCOUNTER — Encounter: Payer: Self-pay | Admitting: Internal Medicine

## 2023-12-04 NOTE — Progress Notes (Signed)
[facility] History and Physical   Primary Care Physician:  Alysia Penna, MD   Reason for Procedure:   Hx colon polyps  Plan:    colonoscopy     HPI: Omar [name] is a 62 y.o. male w/ hx adenomatous colon polyps - last removed 2019. He is here for surveillance exam.   06/2010 - diminutive adenoma 06/30/2015 4 diminutive polyps removed - 3 adenomas 1 hyperplastic  11/25/2018 3 diminutive polyps removed - 2 adenomas 1 mucosal - recall 2024-5   Past Surgical History:  Procedure Laterality Date   COLONOSCOPY     COLONOSCOPY W/ BIOPSIES  06/08/2010   Colon polyps   KNEE ARTHROSCOPY Bilateral 2008   unsure of year   MOUTH SURGERY     jaw fracture   PROSTATECTOMY  09/24/2010   Dr. Heloise Purpura    Prior to Admission medications   Medication Sig Start Date End Date Taking? Authorizing Provider  acetaminophen (TYLENOL) 325 MG tablet 1 tablet as needed Orally every 6 hrs    [provider]  aspirin EC 81 MG tablet Take 81 mg by mouth daily.    [provider]  metFORMIN (GLUCOPHAGE) 500 MG tablet 1 tablet with a meal Orally twice a day for 90 days    [provider]  Multiple Vitamin (MULTIVITAMIN) tablet Take 1 tablet by mouth daily.    [provider]  Polyethylene Glycol 3350 (MIRALAX PO) Take 238 g by mouth once.    [provider]    Current Outpatient Medications  Medication Sig Dispense Refill   acetaminophen (TYLENOL) 325 MG tablet 1 tablet as needed Orally every 6 hrs     aspirin EC 81 MG tablet Take 81 mg by mouth daily.     metFORMIN (GLUCOPHAGE) 500 MG tablet 1 tablet with a meal Orally twice a day for 90 days     Multiple Vitamin (MULTIVITAMIN) tablet Take 1 tablet by mouth daily.     Polyethylene Glycol 3350 (MIRALAX PO) Take 238 g by mouth once.     Current Facility-Administered Medications  Medication Dose Route Frequency Provider Last Rate Last Admin   0.9 %  sodium chloride infusion  500 mL  Intravenous Once Iva Boop, MD        Allergies as of 12/05/2023   (No Known Allergies)    Family History  Problem Relation Age of Onset   Other Mother        Hereditary degenerative bone disease   Colon cancer Mother        unsure of origin (in her 52's)   Diabetes Father    Other Brother        colostomy for diarrhea   Rectal cancer Neg Hx    Colon polyps Neg Hx    Esophageal cancer Neg Hx    Stomach cancer Neg Hx    Colonic polyp Neg Hx     Social History   Socioeconomic History   Marital status: Married    Spouse name: Not on file   Number of children: 2   Years of education: Not on file   Highest education level: Not on file  Occupational History   Occupation: Runner, broadcasting/film/video  Tobacco Use   Smoking status: Former    Types: Cigarettes   Smokeless tobacco: Former    Types: Snuff  Vaping Use   Vaping status: Never Used  Substance and Sexual Activity   Alcohol use: Yes    Comment: 1-2 glass of wine  nightly   Drug use: No   Sexual activity: Not on file  Other Topics Concern   Not on file  Social History Narrative   Daily caffeine          Social Drivers of Health   Financial Resource Strain: Not on file  Food Insecurity: Not on file  Transportation Needs: Not on file  Physical Activity: Not on file  Stress: Not on file  Social Connections: Not on file  Intimate Partner Violence: Not on file    Review of Systems:  All other review of systems negative except as mentioned in the HPI.  Physical Exam: Vital signs BP (!) 139/91   Pulse 68   Temp 97.9 F (36.6 C) (Temporal)   Resp (!) 21   Ht 6' (1.829 m)   Wt 208 lb (94.3 kg)   SpO2 99%   BMI 28.21 kg/m   General:   Alert,  Well-developed, well-nourished, pleasant and cooperative in NAD Lungs:  Clear throughout to auscultation.   Heart:  Regular rate and rhythm; no murmurs, clicks, rubs,  or gallops. Abdomen:  Soft, nontender and nondistended. Normal bowel sounds.   Neuro/Psych:  Alert and  cooperative. Normal mood and affect. A and O x 3   @Tiana Sivertson  Sena Slate, MD, [facility] [phone] (pager) 12/05/2023 10:58 AM@

## 2023-12-05 ENCOUNTER — Encounter: Payer: Self-pay | Admitting: Internal Medicine

## 2023-12-05 ENCOUNTER — Ambulatory Visit: Payer: BC Managed Care – PPO | Admitting: Internal Medicine

## 2023-12-05 VITALS — BP 119/81 | HR 65 | Temp 97.9°F | Resp 16 | Ht 72.0 in | Wt 208.0 lb

## 2023-12-05 DIAGNOSIS — Z860102 Personal history of hyperplastic colon polyps: Secondary | ICD-10-CM | POA: Diagnosis not present

## 2023-12-05 DIAGNOSIS — Z1211 Encounter for screening for malignant neoplasm of colon: Secondary | ICD-10-CM

## 2023-12-05 DIAGNOSIS — Z9079 Acquired absence of other genital organ(s): Secondary | ICD-10-CM

## 2023-12-05 DIAGNOSIS — D125 Benign neoplasm of sigmoid colon: Secondary | ICD-10-CM

## 2023-12-05 DIAGNOSIS — K6289 Other specified diseases of anus and rectum: Secondary | ICD-10-CM

## 2023-12-05 DIAGNOSIS — D120 Benign neoplasm of cecum: Secondary | ICD-10-CM

## 2023-12-05 DIAGNOSIS — Z860101 Personal history of adenomatous and serrated colon polyps: Secondary | ICD-10-CM | POA: Diagnosis not present

## 2023-12-05 DIAGNOSIS — Z860100 Personal history of colon polyps, unspecified: Secondary | ICD-10-CM

## 2023-12-05 MED ORDER — SODIUM CHLORIDE 0.9 % IV SOLN: 500.0000 mL | Freq: Once | INTRAVENOUS | Status: DC

## 2023-12-05 NOTE — Progress Notes (Signed)
Pt's states no medical or surgical changes since previsit or office visit. 

## 2023-12-05 NOTE — Op Note (Signed)
[facility] Patient Name: Omar [name] Procedure Date: 12/05/2023 10:55 AM MRN: [id number] Endoscopist: Iva Boop , MD, [id number] Age: 62 Referring MD:  Date of Birth: [birth year] Gender: Male Account #: [id number] Procedure:                Colonoscopy Indications:              Surveillance: Personal history of adenomatous                            polyps on last colonoscopy 5 years ago, Last                            colonoscopy: 2019 Medicines:                Monitored Anesthesia Care Procedure:                Pre-Anesthesia Assessment:                           - Prior to the procedure, a History and Physical                            was performed, and patient medications and                            allergies were reviewed. The patient's tolerance of                            previous anesthesia was also reviewed. The risks                            and benefits of the procedure and the sedation                            options and risks were discussed with the patient.                            All questions were answered, and informed consent                            was obtained. Prior Anticoagulants: The patient has                            taken no anticoagulant or antiplatelet agents. ASA                            Grade Assessment: II - A patient with mild systemic                            disease. After reviewing the risks and benefits,                            the patient was deemed in satisfactory condition to  undergo the procedure.                           After obtaining informed consent, the colonoscope                            was passed under direct vision. Throughout the                            procedure, the patient's blood pressure, pulse, and                            oxygen saturations were monitored continuously. The                            CF [id number] #[id number] was introduced through the  anus                            and advanced to the the cecum, identified by                            appendiceal orifice and ileocecal valve. The                            colonoscopy was performed without difficulty. The                            patient tolerated the procedure well. The quality                            of the bowel preparation was good. The ileocecal                            valve, appendiceal orifice, and rectum were                            photographed. The bowel preparation used was                            Miralax via split dose instruction. Scope In: 11:04:32 AM Scope Out: 11:18:18 AM Scope Withdrawal Time: 0 hours 10 minutes 49 seconds  Total Procedure Duration: 0 hours 13 minutes 46 seconds  Findings:                 The digital exam findings include surgically absent                            prostate.                           Two sessile polyps were found in the sigmoid colon                            and cecum. The polyps were diminutive in size.  These polyps were removed with a cold snare.                            Resection and retrieval were complete. Verification                            of patient identification for the specimen was                            done. Estimated blood loss was minimal.                           Anal papilla(e) were hypertrophied.                           The exam was otherwise without abnormality on                            direct and retroflexion views. Complications:            No immediate complications. Estimated Blood Loss:     Estimated blood loss was minimal. 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                           - Two diminutive polyps in the sigmoid colon and in                            the cecum, removed with a cold snare. Resected and                            retrieved.                            - Anal papilla(e) were hypertrophied.                           - The examination was otherwise normal on direct                            and retroflexion views.                           - Personal history of colonic polyps. 06/2010 -                            diminutive adenoma                           06/30/2015 4 diminutive polyps removed - 3 adenomas  1 hyperplastic                           11/25/2018 3 diminutive polyps removed - 2 adenomas                            1 mucosal Recommendation:           - Patient has a contact number available for                            emergencies. The signs and symptoms of potential                            delayed complications were discussed with the                            patient. Return to normal activities tomorrow.                            Written discharge instructions were provided to the                            patient.                           - Resume previous diet.                           - Continue present medications.                           - Await pathology results.                           - Repeat colonoscopy is recommended for                            surveillance. The colonoscopy date will be                            determined after pathology results from today's                            exam become available for review. Iva Boop, MD 12/05/2023 11:24:07 AM This report has been signed electronically.

## 2023-12-05 NOTE — Patient Instructions (Addendum)
Two tiny polyps removed today.  I will let you know pathology results and when to have another routine colonoscopy by mail and/or My Chart.  I appreciate the opportunity to care for you. Iva Boop, MD, [facility]  Thank you for letting us care for your healthcare needs today! Please see handout regarding Polyps.   YOU HAD AN ENDOSCOPIC PROCEDURE TODAY AT THE [facility]:   Refer to the procedure report that was given to you for any specific questions about what was found during the examination.  If the procedure report does not answer your questions, please call your gastroenterologist to clarify.  If you requested that your care partner not be given the details of your procedure findings, then the procedure report has been included in a sealed envelope for you to review at your convenience later.  YOU SHOULD EXPECT: Some feelings of bloating in the abdomen. Passage of more gas than usual.  Walking can help get rid of the air that was put into your GI tract during the procedure and reduce the bloating. If you had a lower endoscopy (such as a colonoscopy or flexible sigmoidoscopy) you may notice spotting of blood in your stool or on the toilet paper. If you underwent a bowel prep for your procedure, you may not have a normal bowel movement for a few days.  Please Note:  You might notice some irritation and congestion in your nose or some drainage.  This is from the oxygen used during your procedure.  There is no need for concern and it should clear up in a day or so.  SYMPTOMS TO REPORT IMMEDIATELY:  Following lower endoscopy (colonoscopy or flexible sigmoidoscopy):  Excessive amounts of blood in the stool  Significant tenderness or worsening of abdominal pains  Swelling of the abdomen that is new, acute  Fever of 100F or higher  For urgent or emergent issues, a gastroenterologist can be reached at any hour by calling [phone]. Do not use MyChart messaging for urgent  concerns.    DIET:  We do recommend a small meal at first, but then you may proceed to your regular diet.  Drink plenty of fluids but you should avoid alcoholic beverages for 24 hours.  ACTIVITY:  You should plan to take it easy for the rest of today and you should NOT DRIVE or use heavy machinery until tomorrow (because of the sedation medicines used during the test).    FOLLOW UP: Our staff will call the number listed on your records the next business day following your procedure.  We will call around 7:15- 8:00 am to check on you and address any questions or concerns that you may have regarding the information given to you following your procedure. If we do not reach you, we will leave a message.     If any biopsies were taken you will be contacted by phone or by letter within the next 1-3 weeks.  Please call us at [phone] if you have not heard about the biopsies in 3 weeks.    SIGNATURES/CONFIDENTIALITY: You and/or your care partner have signed paperwork which will be entered into your electronic medical record.  These signatures attest to the fact that that the information above on your After Visit Summary has been reviewed and is understood.  Full responsibility of the confidentiality of this discharge information lies with you and/or your care-partner.

## 2023-12-05 NOTE — Progress Notes (Signed)
Vss nad trans to pacu 

## 2023-12-08 ENCOUNTER — Telehealth: Payer: Self-pay

## 2023-12-08 NOTE — Telephone Encounter (Signed)
Left message on follow up call. 

## 2023-12-11 LAB — SURGICAL PATHOLOGY

## 2023-12-12 ENCOUNTER — Encounter: Payer: Self-pay | Admitting: Internal Medicine
